# Patient Record
Sex: Female | Born: 1935 | Race: White | Hispanic: No | Marital: Married
Health system: Southern US, Community
[De-identification: ages and names within clinical notes are randomized; demographics above are authoritative.]

---

## 2004-12-01 ENCOUNTER — Ambulatory Visit: Payer: Self-pay | Admitting: Internal Medicine

## 2005-03-16 ENCOUNTER — Ambulatory Visit: Payer: Self-pay | Admitting: Internal Medicine

## 2006-03-19 ENCOUNTER — Ambulatory Visit: Payer: Self-pay | Admitting: Internal Medicine

## 2006-09-24 ENCOUNTER — Ambulatory Visit: Payer: Self-pay | Admitting: Unknown Physician Specialty

## 2007-07-30 ENCOUNTER — Ambulatory Visit: Payer: Self-pay | Admitting: Internal Medicine

## 2008-07-31 ENCOUNTER — Ambulatory Visit: Payer: Self-pay | Admitting: Internal Medicine

## 2008-08-06 ENCOUNTER — Ambulatory Visit: Payer: Self-pay | Admitting: Internal Medicine

## 2009-08-02 ENCOUNTER — Ambulatory Visit: Payer: Self-pay | Admitting: Internal Medicine

## 2010-10-20 ENCOUNTER — Ambulatory Visit: Payer: Self-pay | Admitting: Internal Medicine

## 2010-12-30 ENCOUNTER — Emergency Department: Payer: Self-pay | Admitting: Emergency Medicine

## 2012-06-09 ENCOUNTER — Emergency Department: Payer: Self-pay | Admitting: Internal Medicine

## 2012-06-09 LAB — COMPREHENSIVE METABOLIC PANEL
Alkaline Phosphatase: 95 U/L (ref 50–136)
Anion Gap: 9 (ref 7–16)
Bilirubin,Total: 0.7 mg/dL (ref 0.2–1.0)
Chloride: 103 mmol/L (ref 98–107)
Co2: 27 mmol/L (ref 21–32)
EGFR (African American): 28 — ABNORMAL LOW
Osmolality: 283 (ref 275–301)
Potassium: 4.1 mmol/L (ref 3.5–5.1)
Sodium: 139 mmol/L (ref 136–145)

## 2012-06-09 LAB — URINALYSIS, COMPLETE
Blood: NEGATIVE
Glucose,UR: NEGATIVE mg/dL (ref 0–75)
Hyaline Cast: 5
Ketone: NEGATIVE
Nitrite: NEGATIVE
Ph: 6 (ref 4.5–8.0)
Protein: NEGATIVE
RBC,UR: NONE SEEN /HPF (ref 0–5)
Specific Gravity: 1.014 (ref 1.003–1.030)
WBC UR: 178 /HPF (ref 0–5)

## 2012-06-09 LAB — CBC
MCH: 31 pg (ref 26.0–34.0)
MCV: 93 fL (ref 80–100)
Platelet: 200 10*3/uL (ref 150–440)
RDW: 13.4 % (ref 11.5–14.5)
WBC: 14.7 10*3/uL — ABNORMAL HIGH (ref 3.6–11.0)

## 2012-06-09 LAB — CK TOTAL AND CKMB (NOT AT ARMC): CK, Total: 61 U/L (ref 21–215)

## 2012-06-09 LAB — TROPONIN I: Troponin-I: <0.02 ng/mL

## 2012-06-12 LAB — URINE CULTURE

## 2012-06-15 LAB — CULTURE, BLOOD (SINGLE)

## 2012-06-19 ENCOUNTER — Inpatient Hospital Stay: Payer: Self-pay | Admitting: Specialist

## 2012-06-19 LAB — CBC
HCT: 32.3 % — ABNORMAL LOW (ref 35.0–47.0)
HGB: 10.8 g/dL — ABNORMAL LOW (ref 12.0–16.0)
MCH: 30.5 pg (ref 26.0–34.0)
MCHC: 33.5 g/dL (ref 32.0–36.0)
MCV: 91 fL (ref 80–100)
Platelet: 318 10*3/uL (ref 150–440)
RBC: 3.55 10*6/uL — ABNORMAL LOW (ref 3.80–5.20)

## 2012-06-19 LAB — COMPREHENSIVE METABOLIC PANEL
Alkaline Phosphatase: 111 U/L (ref 50–136)
BUN: 77 mg/dL — ABNORMAL HIGH (ref 7–18)
Bilirubin,Total: 0.5 mg/dL (ref 0.2–1.0)
Calcium, Total: 10.5 mg/dL — ABNORMAL HIGH (ref 8.5–10.1)
Chloride: 110 mmol/L — ABNORMAL HIGH (ref 98–107)
Co2: 21 mmol/L (ref 21–32)
Creatinine: 2.1 mg/dL — ABNORMAL HIGH (ref 0.60–1.30)
EGFR (African American): 25 — ABNORMAL LOW
EGFR (Non-African Amer.): 22 — ABNORMAL LOW
Osmolality: 311 (ref 275–301)
SGPT (ALT): 57 U/L (ref 12–78)
Total Protein: 8.6 g/dL — ABNORMAL HIGH (ref 6.4–8.2)

## 2012-06-19 LAB — URINALYSIS, COMPLETE
Bilirubin,UR: NEGATIVE
Blood: NEGATIVE
Glucose,UR: NEGATIVE mg/dL (ref 0–75)
Hyaline Cast: 4
Ketone: NEGATIVE
Leukocyte Esterase: NEGATIVE
Nitrite: NEGATIVE
Ph: 6 (ref 4.5–8.0)
Protein: NEGATIVE
RBC,UR: 1 /HPF (ref 0–5)
Specific Gravity: 1.015 (ref 1.003–1.030)
Squamous Epithelial: 2
WBC UR: <1 /HPF (ref 0–5)

## 2012-06-20 LAB — BASIC METABOLIC PANEL
Calcium, Total: 10.2 mg/dL — ABNORMAL HIGH (ref 8.5–10.1)
EGFR (African American): 39 — ABNORMAL LOW
EGFR (Non-African Amer.): 34 — ABNORMAL LOW
Glucose: 94 mg/dL (ref 65–99)
Osmolality: 311 (ref 275–301)
Potassium: 4 mmol/L (ref 3.5–5.1)

## 2012-06-20 LAB — CBC WITH DIFFERENTIAL/PLATELET
Basophil #: 0.1 10*3/uL (ref 0.0–0.1)
Basophil %: 1 %
Eosinophil #: 0.1 10*3/uL (ref 0.0–0.7)
Eosinophil %: 0.8 %
HGB: 10.7 g/dL — ABNORMAL LOW (ref 12.0–16.0)
Lymphocyte %: 10.7 %
MCHC: 32.5 g/dL (ref 32.0–36.0)
MCV: 93 fL (ref 80–100)
Monocyte #: 0.7 x10 3/mm (ref 0.2–0.9)
Neutrophil #: 5.3 10*3/uL (ref 1.4–6.5)
Neutrophil %: 76.8 %

## 2012-06-20 LAB — URINE CULTURE

## 2012-06-21 LAB — BASIC METABOLIC PANEL
Anion Gap: 6 — ABNORMAL LOW (ref 7–16)
Calcium, Total: 10 mg/dL (ref 8.5–10.1)
Creatinine: 1.21 mg/dL (ref 0.60–1.30)
EGFR (African American): 50 — ABNORMAL LOW
EGFR (Non-African Amer.): 43 — ABNORMAL LOW
Osmolality: 305 (ref 275–301)

## 2012-06-22 ENCOUNTER — Ambulatory Visit: Payer: Self-pay | Admitting: Internal Medicine

## 2012-06-22 LAB — BASIC METABOLIC PANEL
Anion Gap: 10 (ref 7–16)
Calcium, Total: 9.7 mg/dL (ref 8.5–10.1)
Chloride: 110 mmol/L — ABNORMAL HIGH (ref 98–107)
Co2: 23 mmol/L (ref 21–32)
Creatinine: 1.03 mg/dL (ref 0.60–1.30)
EGFR (African American): >60
EGFR (Non-African Amer.): 52 — ABNORMAL LOW
Glucose: 99 mg/dL (ref 65–99)

## 2012-06-25 LAB — CULTURE, BLOOD (SINGLE)

## 2012-07-20 ENCOUNTER — Ambulatory Visit: Payer: Self-pay | Admitting: Internal Medicine

## 2012-07-20 DEATH — deceased

## 2014-05-09 IMAGING — CR DG CHEST 1V PORT
1 series · 1 of 1 positions shown · non-contrast
Comparison: none

REASON FOR EXAM: sob
COMMENTS:

[ap]
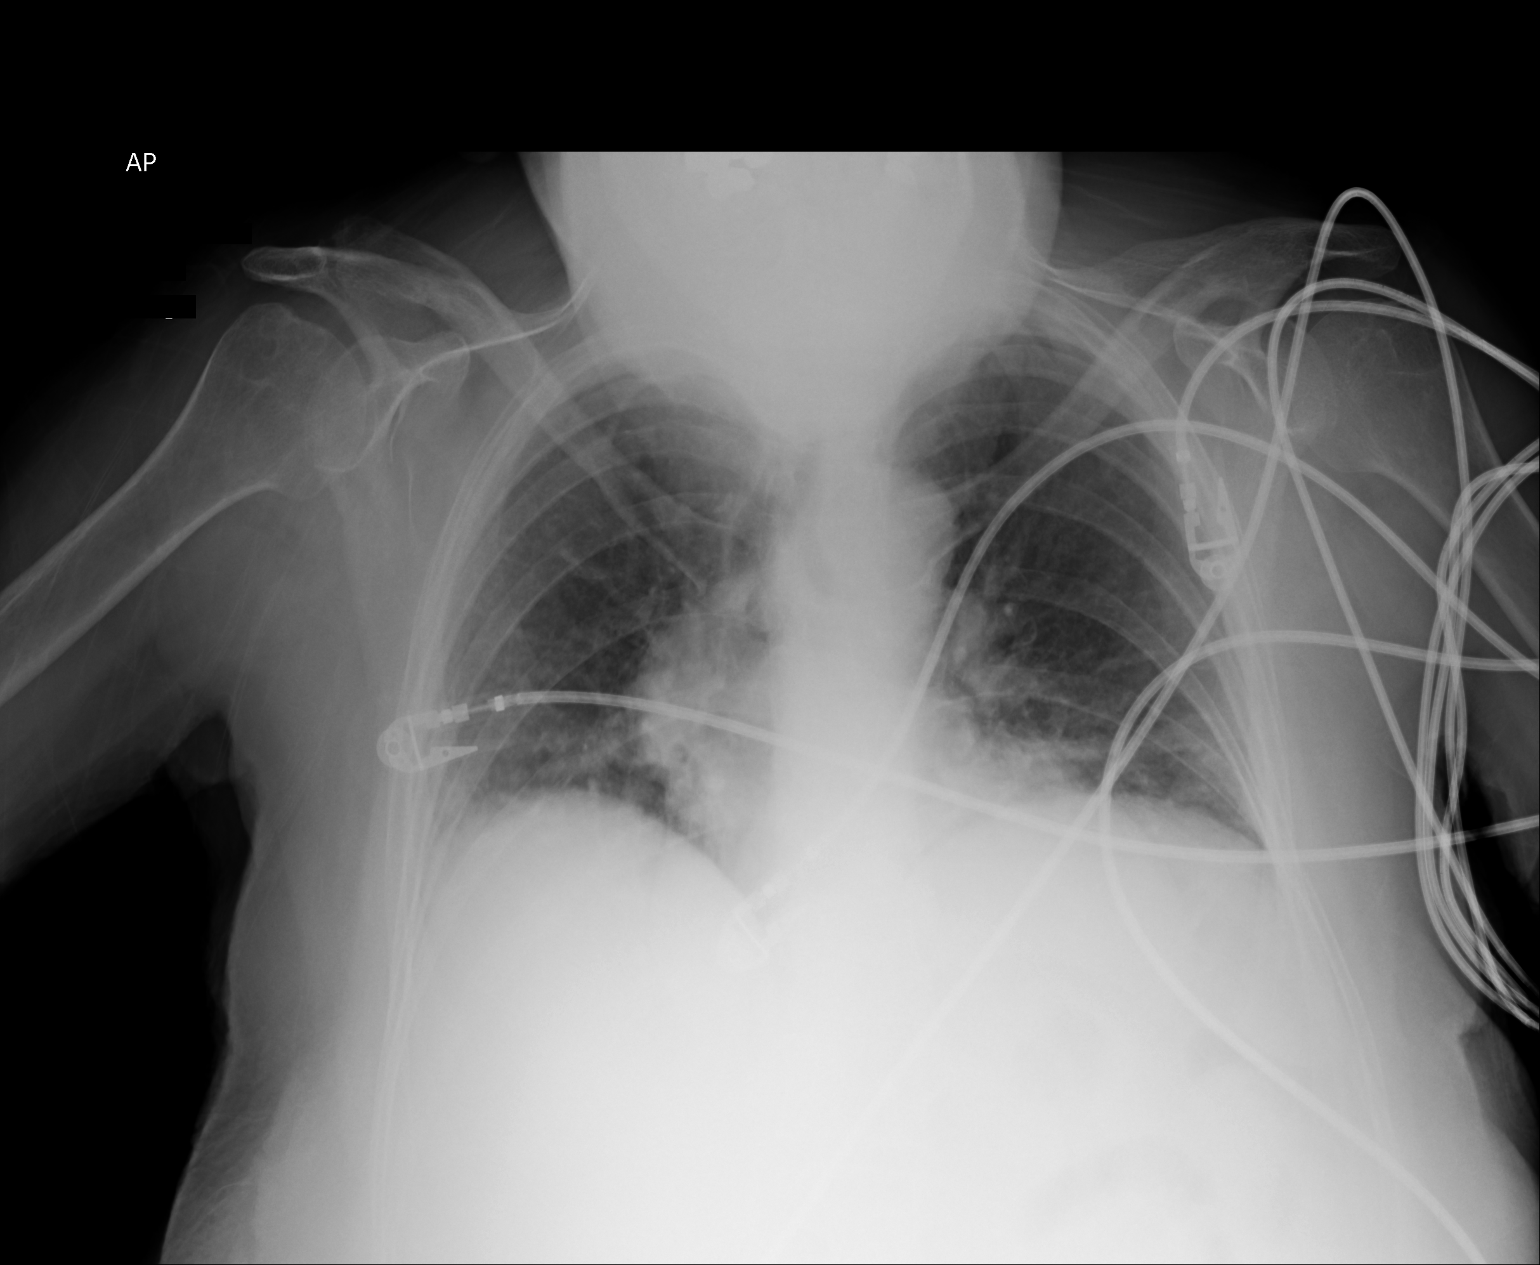

[1 of 1 positions shown; findings below may reference images not displayed]

PROCEDURE:     DXR - DXR PORTABLE CHEST SINGLE VIEW  - June 09, 2012  [DATE]

RESULT:     Comparison is made with study dated 30 December, 2010.

There is a caudal angulation. The chin projects over the lung apices. There
is perihilar fullness especially on the right which could be projectional or
could be secondary to pulmonary vascular congestion or pulmonary
hypertension. Perihilar edema or infiltrate are differential considerations.
There shallow expiratory effort without effusion or pneumothorax.
IMPRESSION: 1. Bilateral hilar prominence greater on the right. Shallow inspiratory
effort with some basal atelectasis.

[REDACTED]

## 2014-05-19 IMAGING — CR DG CHEST 1V PORT
1 series · 1 of 1 positions shown · non-contrast
Comparison: none

REASON FOR EXAM: shortness of breath
COMMENTS:

[ap]
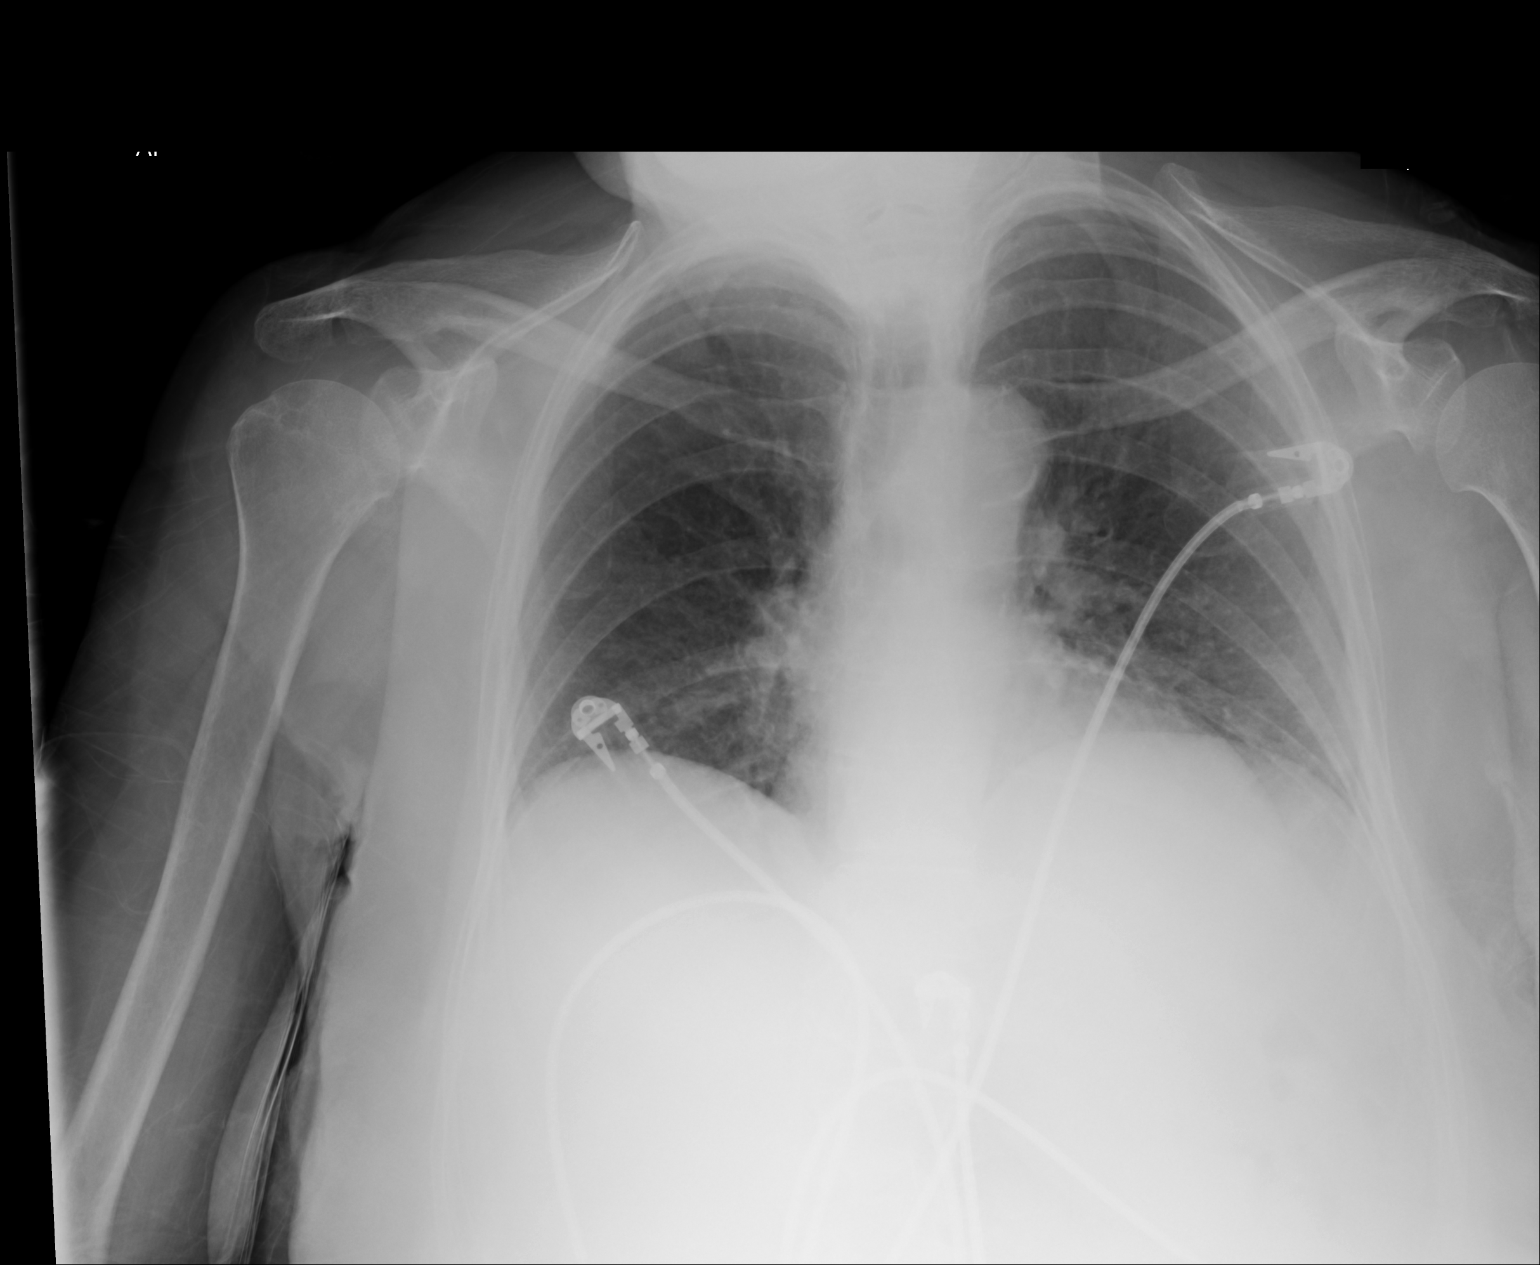

[1 of 1 positions shown; findings below may reference images not displayed]

PROCEDURE:     DXR - DXR PORTABLE CHEST SINGLE VIEW  - June 19, 2012 [DATE]

RESULT:     Comparison is made to the study June 09, 2012.

The lungs are mildly hypoinflated. There is no focal infiltrate. The cardiac
silhouette is top normal in size. The central pulmonary vascularity is
prominent. There is some crowding of the hilar structures as well.
IMPRESSION: There is no evidence of pneumonia. I cannot exclude
low-grade CHF in the appropriate clinical setting. A followup PA and lateral
chest x-ray with deep inspiration would be useful when the patient can
tolerate the procedure.

[REDACTED]

## 2014-09-11 NOTE — Discharge Summary (Signed)
PATIENT NAME:  Toni Larsen, [name] MR#:  [id number] DATE OF BIRTH:  January 07, 1932  DATE OF ADMISSION:  06/19/2012 DATE OF DISCHARGE:  06/25/2012  For a detailed note, please take a look at the history and physical done on admission.   DIAGNOSES AT DISCHARGE: Severe dementia, adult failure to thrive, acute renal failure, dehydration, urinary tract infection.   DIET: The patient is being discharged to hospice home.   ACTIVITY: As tolerated.   DISCHARGE MEDICATIONS:  Roxanol 20 mg/mL q. 1 hour. p.r.n., lorazepam 0.5 to 1 mg q. 2-4 hours p.r.n., ranitidine 150 mg  b.i.d.   Pertinent Studies Done During The Hospital Course: CT scan of the head done without contrast on admission showing no acute intracranial abnormality.   Chest x-ray done on admission showing no evidence of acute pneumonia or cardiopulmonary disease.   BRIEF HOSPITAL COURSE:  This is a 81 year old female with medical problems as mentioned above, presented to the hospital from a facility due to altered mental status. The patient was refusing to take her medications, [facility]  1.  Altered mental status. The exact etiology of the patient's altered mental status and agitation is probably related to dementia. There was some concern that she may have had a urinary tract infection, but she had a urine culture here in the hospital, which was negative. She was empirically treated with IV ceftriaxone. Her mental status did wax and wane. She had appearance of sundowning, agitation. Although mental status is at baseline presently.  2.  Adult failure to thrive.  Due to her severe dementia, the patient was refusing to take her pills or eat or drink anything for the past few days; therefore, has been getting IV fluids. This failure to thrive will continue to process given her severe dementia.  3.  Acute renal failure. This resolved with IV fluid supplementation and currently has normalized. 4.  Hyponatremia also  secondary to dehydration and poor p.o. intake.  This has also improved with IV fluid hydration. Given her severe dementia and poor p.o. intake and a high risk for hospitalization, palliative care consult was obtained to discuss goals of care with the family. After further discussions with the family, they agreed to send the patient to a hospice facility instead of the assisted living with hospice services. The patient therefore is presently being discharged to hospice home, given her severe failure to thrive and severe dementia.   TIME SPENT ON DISCHARGE: 35 minutes     ____________________________ Rolly PancakeVivek J. Cherlynn KaiserSainani, MD vjs:cc D: 06/25/2012 16:18:25 ET T: 06/26/2012 00:04:48 ET JOB#: [id number]  cc: Rolly PancakeVivek J. Cherlynn KaiserSainani, MD, <Dictator> [place] SirenVIVEK J Dreyson Mishkin MD ELECTRONICALLY SIGNED 06/26/2012 8:33

## 2014-09-11 NOTE — H&P (Signed)
PATIENT NAME:  Toni Larsen, Toni Larsen MR#:  [id number] DATE OF BIRTH:  1931/12/29  DATE OF ADMISSION:  06/19/2012  PRIMARY CARE PHYSICIAN: Dr. Lupita Leash.   CHIEF COMPLAINT: Altered mental status.   HISTORY OF PRESENT ILLNESS: This is a 81 year old female, who was sent over from a skilled nursing facility at [facility] due to altered mental status. The altered mental status is described as her refusing to take her medications. She is not eating and drinking and she is more altered than usual. The patient has baseline underlying dementia. Due to the patient's dementia, it is difficult to obtain a good history from the patient, therefore most of the information obtained from the chart and the ER physician.   REVIEW OF SYSTEMS: Otherwise unobtainable given the patient's mental status.   PAST MEDICAL HISTORY: Consistent with dementia, history of chronic kidney disease, stage III, hypertension, GERD, anxiety, history of wheezing and urinary tract infection.   ALLERGIES: ACE INHIBITORS, ARICEPT, CELEBREX, PRAVACHOL, SULFA AND ZOCOR.   SOCIAL HISTORY: Unable to get due to patient's dementia.   FAMILY HISTORY: Unobtainable given the patient's mental status.   CURRENT MEDICATIONS: Are as follows: Ciprofloxacin 500 mg b.i.d., aspirin 81 mg daily, atenolol 50 mg daily, betamethasone, clotrimazole topical cream 1% b.i.d. as needed, Desitin  topical ointment to be applied twice a day needed, Depakote 250 mg at bedtime, Evista 60 mg daily, Allegra 60 mg daily, folic acid  1 mg daily, Mucinex 600 mg b.i.d., hydrochlorothiazide/triamterene 25/37.5, 1 tab daily, loperamide 2 mg as needed for loose stools, losartan 100 mg daily, Tylenol 1000 mg t.i.d. as needed, Namenda 5 mg b.i.d., omeprazole 20 mg daily, Paxil 10 mg daily, and tramadol 50 mg t.i.d. as needed.   PHYSICAL EXAMINATION:   VITAL SIGNS: Temperature 98.9, pulse 81, respirations 22, blood pressure 119/69, satting 96% on room air.  GENERAL: She is a  pleasant-appearing female, nonverbal, but in no apparent distress.  HEENT: Atraumatic, normocephalic. Extraocular muscles are intact. Pupils are equal and reactive to light. Sclerae anicteric. No conjunctival injection. No oropharyngeal erythema. Oral mucosa is dry.  NECK: Supple. There is no jugular venous distention. No bruits, no lymphadenopathy, no thyromegaly.  HEART: Regular rate and rhythm. No murmurs, rubs, or clicks.  LUNGS: Clear to auscultation anteriorly. No rales or rhonchi. No wheezes. Negative use of accessory muscles.  ABDOMEN: Soft, flat, nontender, nondistended. Has good bowel sounds. No hepatosplenomegaly appreciated.  EXTREMITIES: No evidence of any cyanosis, clubbing, or peripheral edema. Has +2 pedal and radial pulses bilaterally.  NEUROLOGICAL: The patient is alert, awake, and oriented x 1. Difficult to do a full neurological exam. Moves all extremities spontaneously. Globally weak.  SKIN: Moist and warm with no rashes.  LYMPHATIC: There is no cervical or axillary lymphadenopathy.   LABORATORY EXAM: Shows serum glucose 125, BUN 77, creatinine 2.1, sodium 144, potassium 4.3, chloride 110, bicarbonate 21. LFTs are within normal limits. Troponin less than 0.02. White cell count 8.1, hemoglobin 10.8, hematocrit 32.3, platelet count 318. Urinalysis is within normal limits.   The patient did have a CT of the head done without contrast showing no evidence of acute intracranial hemorrhage or any evolving ischemic infarction.   ASSESSMENT AND PLAN: This is a 81 year old female with a history of dementia, chronic kidney disease, stage III, hypertension, gastroesophageal reflux disease, anxiety who presents to the hospital due to altered mental status as she was refusing to take her p.o. meds at the skilled nursing facility and refusing to eat or drink.  1.  Altered mental status. The exact etiology is unclear, although suspect it to be secondary to underlying dementia complicated with  metabolic encephalopathy from acute on chronic renal failure and urinary tract infection. I will give her gentle intravenous fluids for acute on chronic renal failure and dehydration and follow her mental status. The patient apparently was on a derivative ciprofloxacin for urinary tract infection, but her Escherichia coli is resistant to ciprofloxacin. Therefore, I will switch her to intravenous ceftriaxone now. Her CT head is negative. Will follow her mental status closely.  2.  Acute on chronic renal failure. Baseline creatinine is around 1.5 to 1.7, currently elevated at 2.2, with a BUN of 77. This was likely secondary to dehydration and poor p.o. intake. I will hydrate her with IV fluids, follow BUN and creatinine and urine output, renal dose medications and avoid nephrotoxins. I will hold her losartan, hydrochlorothiazide and triamterene for now.  3.  Urinary tract infection. Her urinalysis presently is negative, although her urine culture was positive for Escherichia coli. She seemed to have been treated with ciprofloxacin, which Escherichia coli is resistant to. I will put her on some intravenous ceftriaxone for now and follow urine culture.  4.  Gastroesophageal reflux disease. I will continue omeprazole.  5.  Anxiety. Continue Paxil.  6.  Dementia. Continue with Namenda.  7.  Hypertension. I will continue with her atenolol and hold hydrochlorothiazide, triamterene and losartan given her acute renal failure.   CODE STATUS: The patient is a full code.   TIME SPENT ON ADMISSION: 50 minutes.   ____________________________ Rolly PancakeVivek J. Cherlynn KaiserSainani, MD vjs:aw D: 06/19/2012 14:07:38 ET T: 06/19/2012 14:27:00 ET JOB#: [id number]  cc: Rolly PancakeVivek J. Cherlynn KaiserSainani, MD, <Dictator> [place] SirenVIVEK J SAINANI MD ELECTRONICALLY SIGNED 06/19/2012 15:35
